# Patient Record
Sex: Male | Born: 1980 | Hispanic: Yes | Marital: Single | Smoking: Light tobacco smoker
Health system: Southern US, Community
[De-identification: ages and names within clinical notes are randomized; demographics above are authoritative.]

## PROBLEM LIST (undated history)

## (undated) HISTORY — PX: DENTAL SURGERY: SHX609

---

## 2013-08-13 ENCOUNTER — Emergency Department (HOSPITAL_COMMUNITY)
Admission: EM | Admit: 2013-08-13 | Discharge: 2013-08-13 | Disposition: A | Discharge status: Home / Self Care | Payer: No Typology Code available for payment source | Attending: Emergency Medicine | Admitting: Emergency Medicine

## 2013-08-13 ENCOUNTER — Emergency Department (HOSPITAL_COMMUNITY): Payer: No Typology Code available for payment source

## 2013-08-13 ENCOUNTER — Encounter (HOSPITAL_COMMUNITY): Payer: Self-pay | Admitting: Emergency Medicine

## 2013-08-13 DIAGNOSIS — S1093XA Contusion of unspecified part of neck, initial encounter: Secondary | ICD-10-CM

## 2013-08-13 DIAGNOSIS — F17200 Nicotine dependence, unspecified, uncomplicated: Secondary | ICD-10-CM | POA: Insufficient documentation

## 2013-08-13 DIAGNOSIS — S0180XA Unspecified open wound of other part of head, initial encounter: Secondary | ICD-10-CM | POA: Insufficient documentation

## 2013-08-13 DIAGNOSIS — IMO0002 Reserved for concepts with insufficient information to code with codable children: Secondary | ICD-10-CM | POA: Insufficient documentation

## 2013-08-13 DIAGNOSIS — S0081XA Abrasion of other part of head, initial encounter: Secondary | ICD-10-CM

## 2013-08-13 DIAGNOSIS — Y9389 Activity, other specified: Secondary | ICD-10-CM | POA: Insufficient documentation

## 2013-08-13 DIAGNOSIS — S0003XA Contusion of scalp, initial encounter: Secondary | ICD-10-CM | POA: Insufficient documentation

## 2013-08-13 DIAGNOSIS — Y92410 Unspecified street and highway as the place of occurrence of the external cause: Secondary | ICD-10-CM | POA: Insufficient documentation

## 2013-08-13 DIAGNOSIS — S0083XA Contusion of other part of head, initial encounter: Secondary | ICD-10-CM | POA: Insufficient documentation

## 2013-08-13 DIAGNOSIS — H209 Unspecified iridocyclitis: Secondary | ICD-10-CM | POA: Insufficient documentation

## 2013-08-13 DIAGNOSIS — S0190XA Unspecified open wound of unspecified part of head, initial encounter: Secondary | ICD-10-CM | POA: Insufficient documentation

## 2013-08-13 MED ORDER — OXYCODONE-ACETAMINOPHEN 5-325 MG PO TABS
2.0000 | ORAL_TABLET | Freq: Once | ORAL | Status: AC
  Administered 2013-08-13: 2 via ORAL
  Filled 2013-08-13: qty 2

## 2013-08-13 MED ORDER — ONDANSETRON 4 MG PO TBDP
4.0000 mg | ORAL_TABLET | Freq: Once | ORAL | Status: AC
  Administered 2013-08-13: 4 mg via ORAL
  Filled 2013-08-13: qty 1

## 2013-08-13 MED ORDER — NAPROXEN 375 MG PO TABS
375.0000 mg | ORAL_TABLET | Freq: Two times a day (BID) | ORAL | Status: AC
Start: 2013-08-13 — End: ?

## 2013-08-13 MED ORDER — OXYCODONE-ACETAMINOPHEN 5-325 MG PO TABS: 1.0000 | ORAL_TABLET | Freq: Four times a day (QID) | ORAL | Status: AC | PRN

## 2013-08-13 MED ORDER — CYCLOBENZAPRINE HCL 10 MG PO TABS: 10.0000 mg | ORAL_TABLET | Freq: Two times a day (BID) | ORAL | Status: AC | PRN

## 2013-08-13 MED ORDER — CYCLOPENTOLATE HCL 1 % OP SOLN
2.0000 [drp] | Freq: Once | OPHTHALMIC | Status: AC
  Administered 2013-08-13: 2 [drp] via OPHTHALMIC
  Filled 2013-08-13: qty 2

## 2013-08-13 MED ORDER — TETRACAINE HCL 0.5 % OP SOLN
2.0000 [drp] | Freq: Once | OPHTHALMIC | Status: AC
  Administered 2013-08-13: 2 [drp] via OPHTHALMIC
  Filled 2013-08-13: qty 2

## 2013-08-13 MED ORDER — FLUORESCEIN SODIUM 1 MG OP STRP
1.0000 | ORAL_STRIP | Freq: Once | OPHTHALMIC | Status: AC
  Administered 2013-08-13: 1 via OPHTHALMIC
  Filled 2013-08-13: qty 1
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
## 2013-08-13 NOTE — ED Notes (Signed)
Patient transported to CT 
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
## 2013-08-14 NOTE — ED Provider Notes (Signed)
Medical screening examination/treatment/procedure(s) were performed by non-physician practitioner and as supervising physician I was immediately available for consultation/collaboration.   EKG Interpretation None       Jasiel Apachito, MD 08/14/13 0702 
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
